# Patient Record
Sex: Female | Born: 2000 | Race: White | Hispanic: No | Marital: Single | Smoking: Never smoker
Health system: Southern US, Community
[De-identification: ages and names within clinical notes are randomized; demographics above are authoritative.]

## PROBLEM LIST (undated history)

## (undated) DIAGNOSIS — Q796 Ehlers-Danlos syndrome, unspecified: Secondary | ICD-10-CM

---
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
## 2022-02-14 ENCOUNTER — Ambulatory Visit (INDEPENDENT_AMBULATORY_CARE_PROVIDER_SITE_OTHER): Payer: Medicaid Other | Admitting: Orthopedic Surgery

## 2022-02-14 ENCOUNTER — Ambulatory Visit (INDEPENDENT_AMBULATORY_CARE_PROVIDER_SITE_OTHER): Payer: Medicaid Other

## 2022-02-14 ENCOUNTER — Encounter: Payer: Self-pay | Admitting: Orthopedic Surgery

## 2022-02-14 VITALS — Ht 63.0 in | Wt 201.0 lb

## 2022-02-14 DIAGNOSIS — Q796 Ehlers-Danlos syndrome, unspecified: Secondary | ICD-10-CM

## 2022-02-14 NOTE — Progress Notes (Signed)
   Office Visit Note   Patient: Madison Stephens           Date of Birth: 19-Apr-2001           MRN: [id number] Visit Date: 02/14/2022 Requested by: Tracey Harries, MD [address] [place],  [place] [address] PCP: Tracey Harries, MD  Subjective: Chief Complaint  Patient presents with   Right Knee - Follow-up    States needs new brace    HPI: Kawehi is a 21 year old patient with right knee pain.  She has a history of patellar subluxation without dislocation.  She has been using a hinged knee brace in the past.  She does have Ehlers-Danlos.  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  She recently graduated college.              ROS: All systems reviewed are negative as they relate to the chief complaint within the history of present illness.  Patient denies  fevers or chills.   Assessment & Plan: Visit Diagnoses:  1. Ehlers-Danlos disease     Plan: Impression is mild bilateral patellar instability with no effusion and normal alignment today.  Radiographs look reasonable.  She has had good success with this brace in the past.  Plan is right knee bracing to be used during high risk activities.  Continue with quad strengthening exercises and follow-up as needed.  Follow-Up Instructions: No follow-ups on file.   Orders:  Orders Placed This Encounter  Procedures   XR KNEE 3 VIEW RIGHT   No orders of the defined types were placed in this encounter.     Procedures: No procedures performed   Clinical Data: No additional findings.  Objective: Vital Signs: Ht 5\' 3"  (1.6 m)   Wt 201 lb (91.2 kg)   BMI 35.61 kg/m   Physical Exam:   Constitutional: Patient appears well-developed HEENT:  Head: Normocephalic Eyes:EOM are normal Neck: Normal range of motion Cardiovascular: Normal rate Pulmonary/chest: Effort normal Neurologic: Patient is alert Skin: Skin is warm Psychiatric: Patient has normal mood and affect   Ortho Exam: Ortho  exam demonstrates full active and passive range of motion of both knees with no hip pain with internal/external rotation of either leg.  She does hyperextend about 10 to 12 degrees bilaterally.  Mild patellar apprehension bilaterally but no increased laxity with good endpoint with lateral translation at 30 degrees of flexion.  Collateral crucial ligaments are stable with no joint line tenderness medially or laterally in either knee.  Specialty Comments:  No specialty comments available.  Imaging: XR KNEE 3 VIEW RIGHT  Result Date: 02/14/2022 AP lateral merchant radiographic views right knee reviewed.  No arthritis present.  No acute fracture.  Alignment intact.  Patella slightly lateralized on the AP view.  Well centered within the trochlear groove on the axial view.    PMFS History: There are no problems to display for this patient.  Past Medical History:  Diagnosis Date   Ehlers-Danlos syndrome     History reviewed. No pertinent family history.  History reviewed. No pertinent surgical history. Social History   Occupational History   Not on file  Tobacco Use   Smoking status: Never   Smokeless tobacco: Never  Substance and Sexual Activity   Alcohol use: Not on file   Drug use: Not on file   Sexual activity: Not on file

## 2023-10-09 ENCOUNTER — Ambulatory Visit (INDEPENDENT_AMBULATORY_CARE_PROVIDER_SITE_OTHER): Payer: BC Managed Care – PPO | Admitting: Surgical

## 2023-10-09 ENCOUNTER — Encounter: Payer: Self-pay | Admitting: Orthopedic Surgery

## 2023-10-09 DIAGNOSIS — Q796 Ehlers-Danlos syndrome, unspecified: Secondary | ICD-10-CM

## 2023-10-09 NOTE — Progress Notes (Signed)
   Office Visit Note   Patient: Madison Stephens           Date of Birth: [birth year]           MRN: [id number] Visit Date: 10/09/2023 Requested by: Tracey Harries, MD [address] [place],  [place] [address] PCP: Tracey Harries, MD  Subjective: Chief Complaint  Patient presents with   Right Knee - Pain    HPI: Madison Stephens is a 23 y.o. female who presents to the office reporting history of right knee patellar instability.  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  She has not had any instability episodes over the last year since her last visit.  She uses a hinged knee brace when she works in order to provide some stability for her patella.  Her work involves a lot of lifting boxes up to 50 pounds.  She does not use the knee brace all the time and really does not use it when she is around her house or living her life outside of work.  .                ROS: All systems reviewed are negative as they relate to the chief complaint within the history of present illness.  Patient denies fevers or chills.  Assessment & Plan: Visit Diagnoses:  1. Ehlers-Danlos disease     Plan: Patient is a 23 year old female who returns for evaluation of right knee patellar instability.  She really just wants a new hinged knee brace today.  This was provided for her.  No new instability episodes but she will return if she has new injury.  Brace provided and follow-up as needed.  Follow-Up Instructions: No follow-ups on file.   Orders:  No orders of the defined types were placed in this encounter.  No orders of the defined types were placed in this encounter.     Procedures: No procedures performed   Clinical Data: No additional findings.  Objective: Vital Signs: There were no vitals taken for this visit.  Physical Exam:  Constitutional: Patient appears well-developed HEENT:  Head: Normocephalic Eyes:EOM are normal Neck: Normal range of  motion Cardiovascular: Normal rate Pulmonary/chest: Effort normal Neurologic: Patient is alert Skin: Skin is warm Psychiatric: Patient has normal mood and affect  Ortho Exam: Ortho exam demonstrates right knee without effusion.  Positive patellar apprehension sign.  She has increased laxity with lateral translation of the patella with knee flexed to 30 degrees and has 0 degrees extension.  No cellulitis or skin changes noted.  Able to perform straight leg raise with excellent quad strength rated 5/5.  No calf tenderness.  Specialty Comments:  No specialty comments available.  Imaging: No results found.   PMFS History: There are no active problems to display for this patient.  Past Medical History:  Diagnosis Date   Ehlers-Danlos syndrome     No family history on file.  No past surgical history on file. Social History   Occupational History   Not on file  Tobacco Use   Smoking status: Never   Smokeless tobacco: Never  Substance and Sexual Activity   Alcohol use: Not on file   Drug use: Not on file   Sexual activity: Not on file
